# Patient Record
Sex: Male | Born: 1965 | Race: White | Hispanic: No | Smoking: Never smoker
Health system: Southern US, Community
[De-identification: ages and names within clinical notes are randomized; demographics above are authoritative.]

## PROBLEM LIST (undated history)

## (undated) DIAGNOSIS — R251 Tremor, unspecified: Secondary | ICD-10-CM

## (undated) DIAGNOSIS — I10 Essential (primary) hypertension: Secondary | ICD-10-CM

## (undated) DIAGNOSIS — I341 Nonrheumatic mitral (valve) prolapse: Secondary | ICD-10-CM

## (undated) HISTORY — DX: Nonrheumatic mitral (valve) prolapse: I34.1

## (undated) HISTORY — DX: Essential (primary) hypertension: I10

## (undated) HISTORY — DX: Tremor, unspecified: R25.1

---

## 1987-07-14 HISTORY — PX: OTHER SURGICAL HISTORY: SHX169

## 2004-04-24 ENCOUNTER — Ambulatory Visit (HOSPITAL_COMMUNITY): Admission: RE | Admit: 2004-04-24 | Discharge: 2004-04-24 | Payer: Self-pay | Admitting: Orthopedic Surgery

## 2004-05-25 ENCOUNTER — Ambulatory Visit (HOSPITAL_BASED_OUTPATIENT_CLINIC_OR_DEPARTMENT_OTHER): Admission: RE | Admit: 2004-05-25 | Discharge: 2004-05-25 | Payer: Self-pay | Admitting: Orthopedic Surgery

## 2004-05-25 ENCOUNTER — Ambulatory Visit (HOSPITAL_COMMUNITY): Admission: RE | Admit: 2004-05-25 | Discharge: 2004-05-25 | Payer: Self-pay | Admitting: Orthopedic Surgery

## 2005-12-03 IMAGING — US US EXTREM LOW NON VASC*L*
1 series · 14 of 25 positions shown · non-contrast
Comparison: Recent plain films of the left hand from Dr. [REDACTED].

CLINICAL DATA: Palpable mass along the hypothenar eminence of the left hand.

ULTRASOUND OF THE LEFT UPPER EXTREMITY  (HAND) 04/24/2004:

[Series 1: unknown · 0.07mm/px · 14 of 28 slices shown]
[im 1/28]
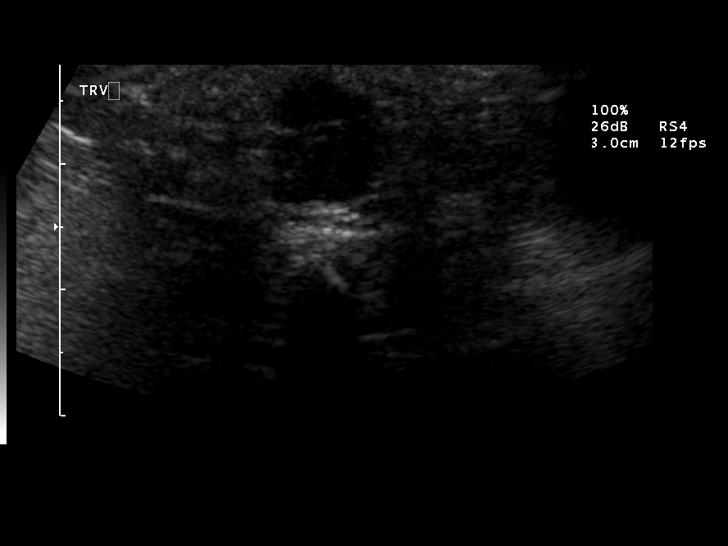
[im 3/28]
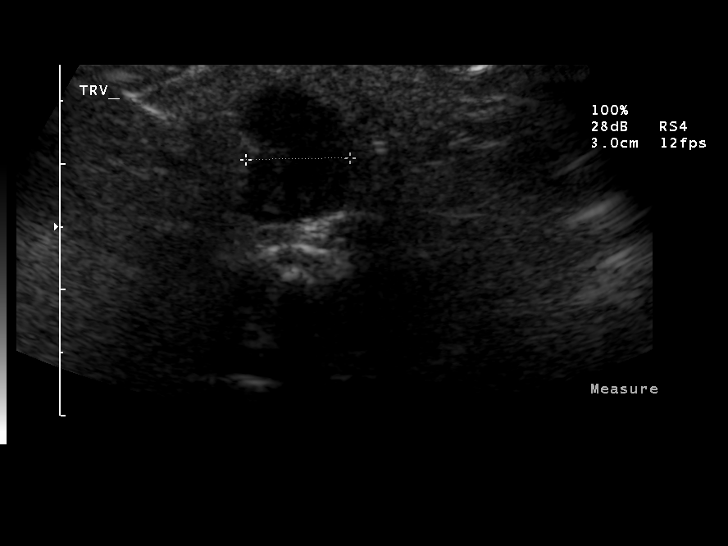
[im 5/28]
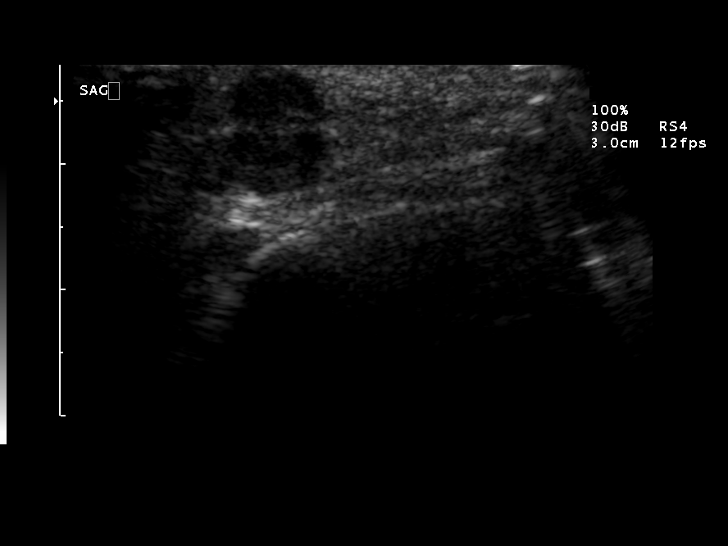
[im 7/28]
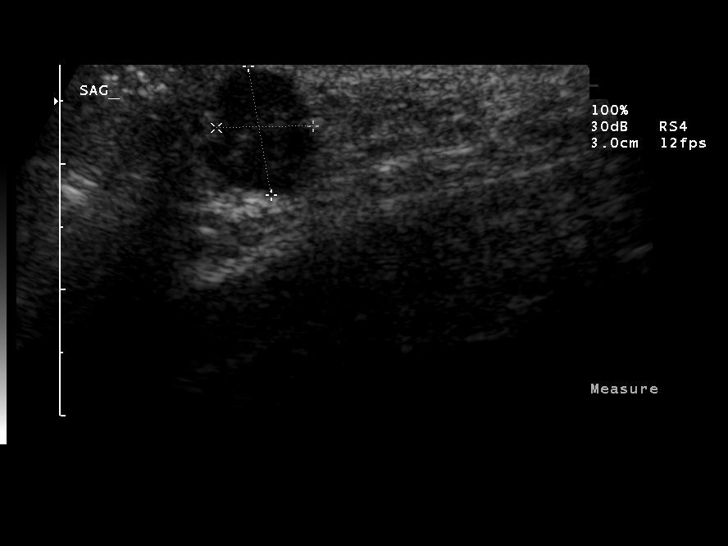
[im 10/28]
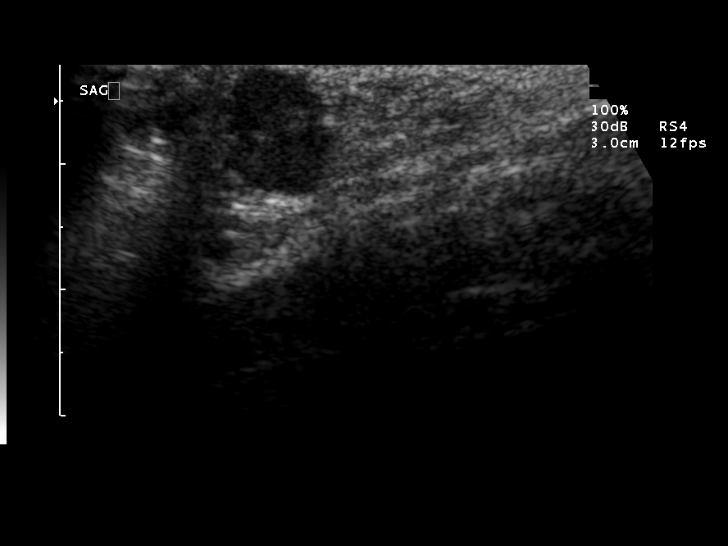
[im 11/28]
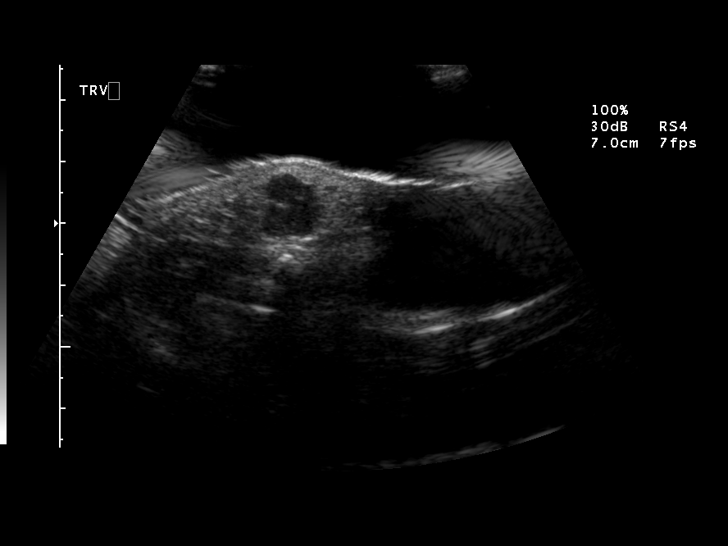
[im 13/28]
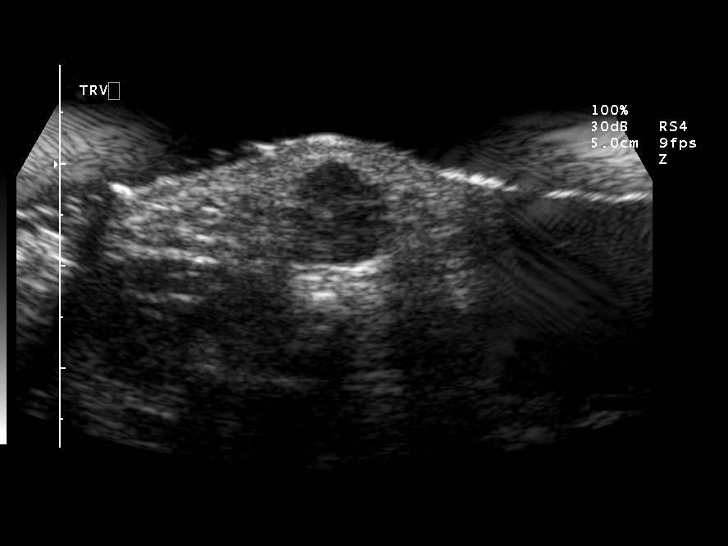
[im 15/28]
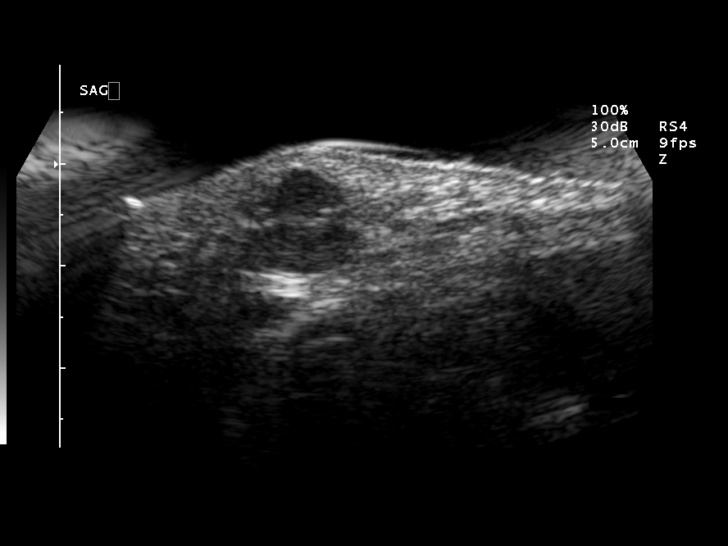
[im 17/28]
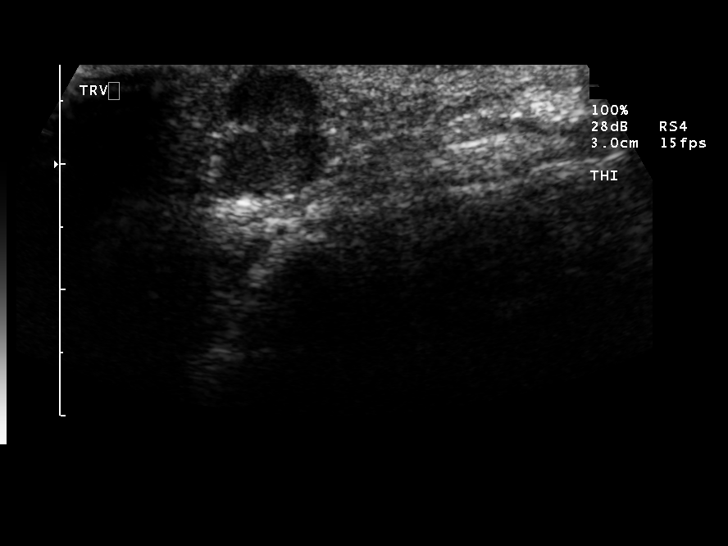
[im 19/28]
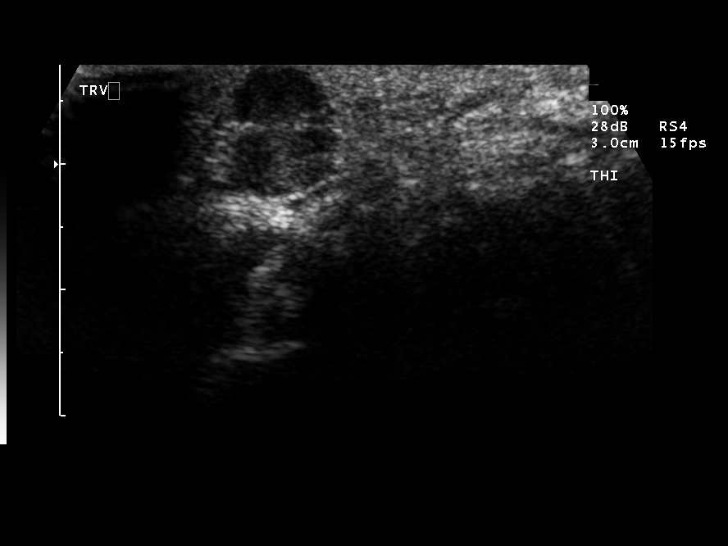
[im 21/28]
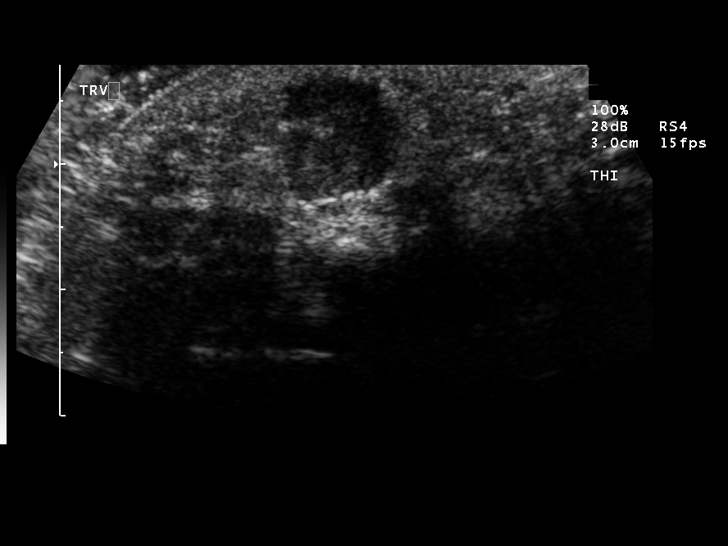
[im 23/28]
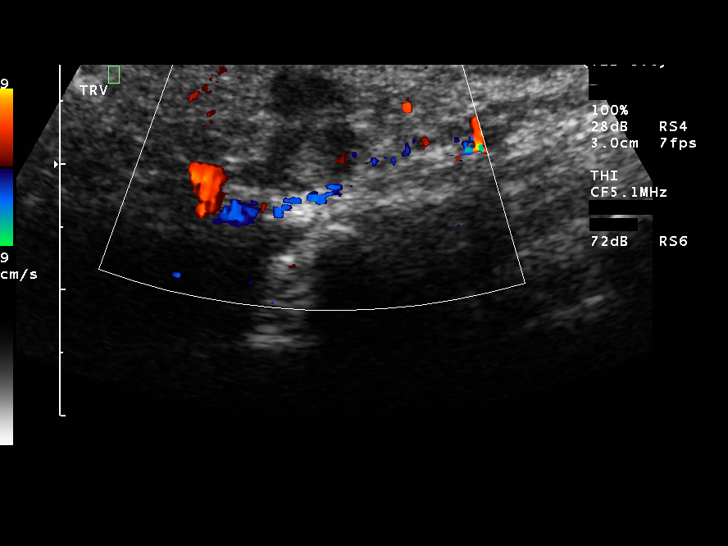
[im 25/28]
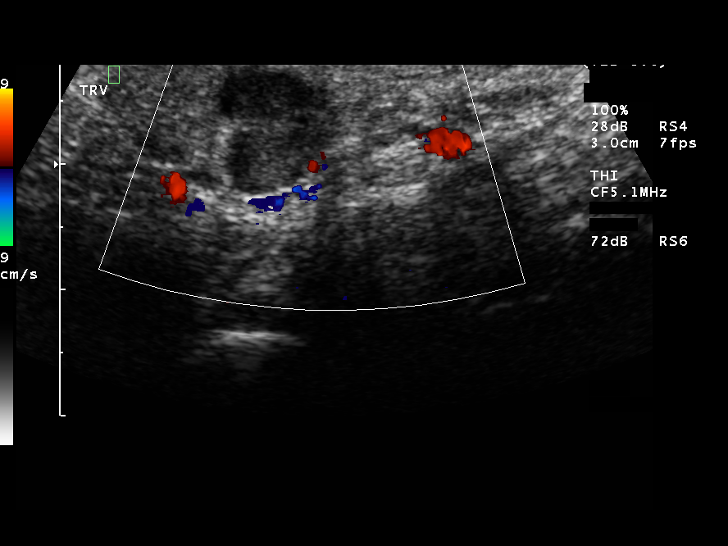
[im 28/28]
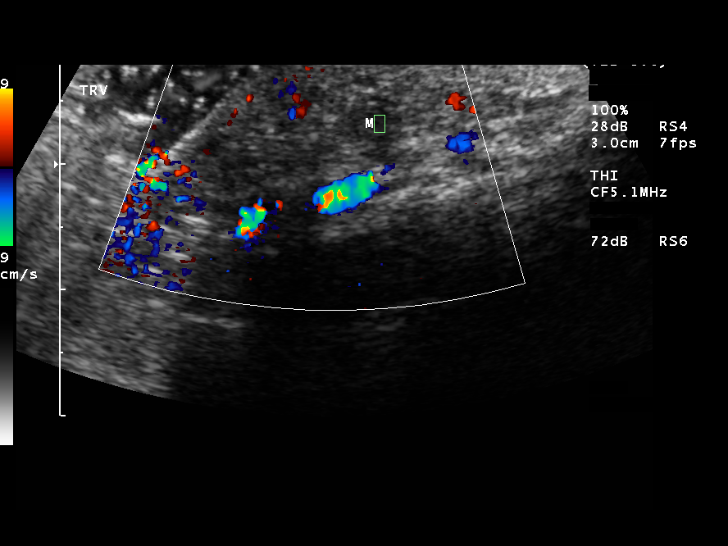

[14 of 25 positions shown; findings below may reference images not displayed]

FINDINGS: The mass along the hypothenar eminence is a hypoechoic lesion that
measures approximately 1.0 x 0.8 x 0.8 cm. It is intimately associated with one
of the branches of the ulnar artery in the hand, possibly the hypothenar artery.
There is internal flow within the mass upon color Doppler interrogation.
IMPRESSION: Hypoechoic approximate 1 cm mass in the hypothenar eminence of the left hand.
The mass is intimately associated with one of the arteries in the hand, and
therefore, an aneurysm is not excluded. Differential diagnosis might include a
neuroma.

## 2006-02-12 HISTORY — PX: OTHER SURGICAL HISTORY: SHX169

## 2016-03-12 DIAGNOSIS — L570 Actinic keratosis: Secondary | ICD-10-CM | POA: Diagnosis not present

## 2016-08-20 DIAGNOSIS — L821 Other seborrheic keratosis: Secondary | ICD-10-CM | POA: Diagnosis not present

## 2016-08-20 DIAGNOSIS — Z85828 Personal history of other malignant neoplasm of skin: Secondary | ICD-10-CM | POA: Diagnosis not present

## 2016-08-20 DIAGNOSIS — L738 Other specified follicular disorders: Secondary | ICD-10-CM | POA: Diagnosis not present

## 2017-04-26 DIAGNOSIS — R079 Chest pain, unspecified: Secondary | ICD-10-CM | POA: Diagnosis not present

## 2017-04-26 DIAGNOSIS — I10 Essential (primary) hypertension: Secondary | ICD-10-CM | POA: Diagnosis not present

## 2017-04-26 DIAGNOSIS — R002 Palpitations: Secondary | ICD-10-CM | POA: Diagnosis not present

## 2017-04-26 DIAGNOSIS — Z13220 Encounter for screening for lipoid disorders: Secondary | ICD-10-CM | POA: Diagnosis not present

## 2017-04-29 ENCOUNTER — Telehealth: Payer: Self-pay

## 2017-04-29 NOTE — Telephone Encounter (Signed)
Sent referral to scheduling, attached notes to March file.

## 2017-05-09 ENCOUNTER — Encounter: Payer: Self-pay | Admitting: Internal Medicine

## 2017-05-09 ENCOUNTER — Ambulatory Visit (INDEPENDENT_AMBULATORY_CARE_PROVIDER_SITE_OTHER): Payer: 59 | Admitting: Internal Medicine

## 2017-05-09 ENCOUNTER — Encounter (INDEPENDENT_AMBULATORY_CARE_PROVIDER_SITE_OTHER): Payer: Self-pay

## 2017-05-09 VITALS — BP 110/80 | HR 73 | Ht 67.0 in | Wt 162.6 lb

## 2017-05-09 DIAGNOSIS — I10 Essential (primary) hypertension: Secondary | ICD-10-CM | POA: Diagnosis not present

## 2017-05-09 DIAGNOSIS — I341 Nonrheumatic mitral (valve) prolapse: Secondary | ICD-10-CM | POA: Diagnosis not present

## 2017-05-09 DIAGNOSIS — R002 Palpitations: Secondary | ICD-10-CM | POA: Diagnosis not present

## 2017-05-09 DIAGNOSIS — R0602 Shortness of breath: Secondary | ICD-10-CM

## 2017-05-09 DIAGNOSIS — R0789 Other chest pain: Secondary | ICD-10-CM

## 2017-05-09 MED ORDER — FAMOTIDINE 20 MG PO TABS: 20.0000 mg | ORAL_TABLET | Freq: Two times a day (BID) | ORAL | 3 refills | Status: AC

## 2017-05-09 NOTE — Progress Notes (Signed)
New Outpatient Visit Date: 05/09/2017  Referring Provider: London Pepper, MD [place] [address], [place] [address]  Chief Complaint: Chest pain and irregular heart beat  HPI:  Mr. Hawes is a 52 y.o. male who is being seen today for the evaluation of chest pain and irregular heart beat at the request of Dr. Orland Mustard. He has a history of mitral valve prolapse, tremor, and recently diagnosed hypertesion.  Mr. Lazcano reports that over the last 1.5 years, he has experienced an almost constant dull ache in the left side of the chest.  The intensity is typically 3-4/10 but occasionally will flair up to 7/10.  During the flairs, which occur once or twice a month, he also notes some pressure in the left side of the neck.  He notes occasional accompanying shortness of breath.  He also reports intermittent palpitations during which it feels like his heart races for a few seconds.  This occurs about once a month and is not related to the aforementioned chest pain.  He denies heart burn but burps frequently.  He has not exercised regularly the last few months.  However, the aforementioned chest pain actually seems to improve with activity.  Mr. Lege reports extensive cardiac workup in [facility] about 20 years ago, including echo, stress test, and cardiac catheterization.  Other than previously noted mitral valve prolapse, he believes that everything was normal.  Earlier this month, his blood pressure was found to be quite elevated at Dr. Darien Ramus office, prompting initiation of lisinopril 10 mg daily.  Mr. Weigelt has been tolerating this well.  He denies lightheadedness, edema, orthopnea, and claudication.  --------------------------------------------------------------------------------------------------  Cardiovascular History & Procedures: Cardiovascular Problems:  Palpitations  Chest pain  Mitral valve prolapse  Risk Factors:  Male gender  Cath/PCI:  None  CV  Surgery:  None  EP Procedures and Devices:  None  Non-Invasive Evaluation(s):  None  --------------------------------------------------------------------------------------------------  Past Medical History:  Diagnosis Date  . Elevated BP without diagnosis of hypertension   . Hypertension   . Mitral valve prolapse     Past Surgical History:  Procedure Laterality Date  . left hand ganglion cyst removal  2008  . right inguinal hernia  07/1987    Current Meds  Medication Sig  . lisinopril (PRINIVIL,ZESTRIL) 10 MG tablet Take 10 mg by mouth daily.  Marland Kitchen SPIRULINA PO Take 1 tablet by mouth daily.    Allergies: Patient has no known allergies.  Social History   Socioeconomic History  . Marital status: Divorced    Spouse name: Not on file  . Number of children: Not on file  . Years of education: Not on file  . Highest education level: Not on file  Occupational History  . Not on file  Social Needs  . Financial resource strain: Not on file  . Food insecurity:    Worry: Not on file    Inability: Not on file  . Transportation needs:    Medical: Not on file    Non-medical: Not on file  Tobacco Use  . Smoking status: Never Smoker  . Smokeless tobacco: Never Used  Substance and Sexual Activity  . Alcohol use: Yes    Comment: 3 drinks per month  . Drug use: Not on file  . Sexual activity: Not on file  Lifestyle  . Physical activity:    Days per week: Not on file    Minutes per session: Not on file  . Stress: Not on file  Relationships  .  Social connections:    Talks on phone: Not on file    Gets together: Not on file    Attends religious service: Not on file    Active member of club or organization: Not on file    Attends meetings of clubs or organizations: Not on file    Relationship status: Not on file  . Intimate partner violence:    Fear of current or ex partner: Not on file    Emotionally abused: Not on file    Physically abused: Not on file    Forced  sexual activity: Not on file  Other Topics Concern  . Not on file  Social History Narrative  . Not on file    Family History  Problem Relation Age of Onset  . Heart attack Mother 23  . Coronary artery disease Mother   . Dementia Father   . Other Brother        reflux  . Healthy Brother     Review of Systems: A 12-system review of systems was performed and was negative except as noted in the HPI.  --------------------------------------------------------------------------------------------------  Physical Exam: BP 110/80   Pulse 73   Ht 5\' 7"  (1.702 m)   Wt 162 lb 9.6 oz (73.8 kg)   BMI 25.47 kg/m   General:  Well-developed, well-nourished man, seated comfortably in the exam room. HEENT: No conjunctival pallor or scleral icterus. Moist mucous membranes. OP clear. Neck: Supple without lymphadenopathy, thyromegaly, JVD, or HJR. No carotid bruit. Lungs: Normal work of breathing. Clear to auscultation bilaterally without wheezes or crackles. Heart: Regular rate and rhythm without murmurs, rubs, or gallops. Non-displaced PMI. Abd: Bowel sounds present. Soft, NT/ND without hepatosplenomegaly Ext: No lower extremity edema. Radial, PT, and DP pulses are 2+ bilaterally Skin: Warm and dry without rash. Neuro: CNIII-XII intact. Strength and fine-touch sensation intact in upper and lower extremities bilaterally. Psych: Normal mood and affect.  EKG:  NSR without abnormalities.  Outside labs (04/26/17): BMP: Na 139, K 4.4, Cl 105, CO2 27, BUN 12, creatinine 0.92, Ca 9.2  CBC: WBC 4.6, HGB 15.9, HCT 46.7%, platelets 208  Lipid panel: Total cholesterol 165, HDL 40, LDL 107, triglycerides 91  TSH: 1.76  --------------------------------------------------------------------------------------------------  ASSESSMENT AND PLAN: Atypical chest pain and shortness of breath Symptoms present almost constantly for 1.5 years with occasional flairs, which is not typical for angina.  Cardiac  risk factors include family history, male gender, and recently diagnosed hypertension.  I have recommended an exercise tolerance test and transthoracic echocardiogram (given h/o MVP and intermittent shortness of breath).  Given potential for GERD as well, I have recommended that Mr. Blocher start taking famotidine 20 mg BID.  Palpitations Sporadic and brief.  No accompanying worrisome symptoms.  We will proceed with stress test and echo, as above.  Given relative infrequent occurrence, we will defer event monitor placement.  Recent labs by Dr. Orland Mustard were normal.  Essential hypertension BP well-controlled today.  Continue lisinopril and follow-up with Dr. Orland Mustard.  Follow-up: Return to clinic in 6 weeks.  Nelva Bush, MD 05/09/2017 8:42 AM

## 2017-05-09 NOTE — Patient Instructions (Addendum)
Medication Instructions:  Famotidine (pepcid) 20 mg by mouth two times per day  -- If you need a refill on your cardiac medications before your next appointment, please call your pharmacy. --  Labwork: None ordered  Testing/Procedures: To be scheduled in 1-2 weeks  Your physician has requested that you have an echocardiogram. Echocardiography is a painless test that uses sound waves to create images of your heart. It provides your doctor with information about the size and shape of your heart and how well your heart's chambers and valves are working. This procedure takes approximately one hour. There are no restrictions for this procedure.  Your physician has requested that you have an exercise tolerance test. For further information please visit [email or web address]. Please also follow instruction sheet, as given.   Follow-Up: Your physician wants you to follow-up in: 6 weeks with APP     Thank you for choosing [facility]!!    Any Other Special Instructions Will Be Listed Below (If Applicable).

## 2017-05-13 ENCOUNTER — Ambulatory Visit (HOSPITAL_COMMUNITY): Payer: 59 | Attending: Internal Medicine

## 2017-05-13 ENCOUNTER — Other Ambulatory Visit: Payer: Self-pay

## 2017-05-13 ENCOUNTER — Ambulatory Visit (INDEPENDENT_AMBULATORY_CARE_PROVIDER_SITE_OTHER): Payer: 59

## 2017-05-13 DIAGNOSIS — R0789 Other chest pain: Secondary | ICD-10-CM | POA: Insufficient documentation

## 2017-05-13 DIAGNOSIS — I119 Hypertensive heart disease without heart failure: Secondary | ICD-10-CM | POA: Diagnosis not present

## 2017-05-13 DIAGNOSIS — R0600 Dyspnea, unspecified: Secondary | ICD-10-CM | POA: Insufficient documentation

## 2017-05-13 DIAGNOSIS — R0602 Shortness of breath: Secondary | ICD-10-CM | POA: Insufficient documentation

## 2017-05-13 DIAGNOSIS — Z8249 Family history of ischemic heart disease and other diseases of the circulatory system: Secondary | ICD-10-CM | POA: Diagnosis not present

## 2017-05-13 DIAGNOSIS — R079 Chest pain, unspecified: Secondary | ICD-10-CM | POA: Diagnosis not present

## 2017-05-13 DIAGNOSIS — I341 Nonrheumatic mitral (valve) prolapse: Secondary | ICD-10-CM | POA: Diagnosis not present

## 2017-05-13 DIAGNOSIS — R002 Palpitations: Secondary | ICD-10-CM | POA: Insufficient documentation

## 2017-05-13 LAB — EXERCISE TOLERANCE TEST
CHL CUP MPHR: 167 {beats}/min
Estimated workload: 10.1 METS
Exercise duration (min): 8 min
Exercise duration (sec): 30 s
Peak HR: 160 {beats}/min
Percent HR: 95 %
RPE: 17
Rest HR: 69 {beats}/min

## 2017-05-17 DIAGNOSIS — I10 Essential (primary) hypertension: Secondary | ICD-10-CM | POA: Diagnosis not present

## 2017-06-12 ENCOUNTER — Encounter: Payer: Self-pay | Admitting: Physician Assistant

## 2017-06-26 DIAGNOSIS — Z1211 Encounter for screening for malignant neoplasm of colon: Secondary | ICD-10-CM | POA: Diagnosis not present

## 2017-07-01 NOTE — Progress Notes (Signed)
Cardiology Office Note:    Date:  07/02/2017   ID:  CONCEPCION KIRKPATRICK, DOB 1965-06-20, MRN [id number]  PCP:  London Pepper, MD  Cardiologist:  Nelva Bush, MD   Referring MD: London Pepper, MD   Chief Complaint  Patient presents with  . Follow-up    results of recent echo and stress test    History of Present Illness:    Damon Haas is a 52 y.o. male with hypertension, MVP, tremor.  He was evaluated by Dr. Harrell Gave End in 3/19 for chest pain and an irregular heartbeat.  A GXT and echocardiogram were arranged.  The GXT was normal and the echocardiogram demonstrated normal ejection fraction with mild diastolic dysfunction. Due to the infrequent nature of his palpitations, it was not felt to be necessary to obtain an event monitor.    Mr. Burridge returns for follow up.  He is here alone.  He is doing well.  He denies any chest discomfort, shortness of breath, edema, palpitations.   Prior CV studies:   The following studies were reviewed today:  Echo 05/13/17 EF 55-60, Gr 1 DD  Exercise Tolerance Test 05/13/17 Normal exercise stress test. Normal exercise tolerance. Normal BP response to exertion.   Past Medical History:  Diagnosis Date  . Hypertension   . Mitral valve prolapse   . Tremor    Surgical Hx: The patient  has a past surgical history that includes left hand ganglion cyst removal (2008) and right inguinal hernia (07/1987).   Current Medications: Current Meds  Medication Sig  . famotidine (PEPCID) 20 MG tablet Take 1 tablet (20 mg total) by mouth 2 (two) times daily.  Marland Kitchen lisinopril (PRINIVIL,ZESTRIL) 10 MG tablet Take 10 mg by mouth daily.  Marland Kitchen SPIRULINA PO Take 1 tablet by mouth daily.     Allergies:   Patient has no known allergies.   Social History   Tobacco Use  . Smoking status: Never Smoker  . Smokeless tobacco: Never Used  Substance Use Topics  . Alcohol use: Yes    Comment: 3 drinks per month  . Drug use: Never     Family Hx: The  patient's family history includes Coronary artery disease in his mother; Dementia in his father; Healthy in his brother; Heart attack (age of onset: 72) in his mother; Other in his brother.  ROS:   Please see the history of present illness.    ROS All other systems reviewed and are negative.   EKGs/Labs/Other Test Reviewed:    EKG:  EKG is not ordered today.    Recent Labs: No results found for requested labs within last 8760 hours.   Recent Lipid Panel No results found for: CHOL, TRIG, HDL, CHOLHDL, LDLCALC, LDLDIRECT  Physical Exam:    VS:  BP 118/78   Pulse 91   Ht 5\' 7"  (1.702 m)   Wt 163 lb 4 oz (74 kg)   SpO2 98%   BMI 25.57 kg/m     Wt Readings from Last 3 Encounters:  07/02/17 163 lb 4 oz (74 kg)  05/09/17 162 lb 9.6 oz (73.8 kg)     Physical Exam  Constitutional: He is oriented to person, place, and time. He appears well-developed and well-nourished. No distress.  HENT:  Head: Normocephalic and atraumatic.  Neck: Neck supple. Carotid bruit is not present.  Cardiovascular: Normal rate, regular rhythm, S1 normal and S2 normal.  No murmur heard. Pulmonary/Chest: Breath sounds normal. He has no rales.  Abdominal: Soft.  Musculoskeletal:  He exhibits no edema.  Neurological: He is alert and oriented to person, place, and time.  Skin: Skin is warm and dry.    ASSESSMENT & PLAN:    Essential hypertension The patient's blood pressure is controlled on his current regimen.  Continue current therapy.    Palpitations Overall quiescent.  Recent stress test was low risk and the echocardiogram demonstrated normal ejection fraction.  We discussed his results today.  He denies any chest pain.  At this point, he can follow up with Cardiology as needed.   Dispo:  Return As needed, for recurrent symptoms w/ Dr. Saunders Revel, or Richardson Dopp, PA-C.   Medication Adjustments/Labs and Tests Ordered: Current medicines are reviewed at length with the patient today.  Concerns regarding  medicines are outlined above.  Tests Ordered: No orders of the defined types were placed in this encounter.  Medication Changes: No orders of the defined types were placed in this encounter.   Signed, Richardson Dopp, PA-C  07/02/2017 8:34 AM    [facility] [address], [place], [place]  [address] Phone: [phone]; Fax: [phone]

## 2017-07-02 ENCOUNTER — Encounter: Payer: Self-pay | Admitting: Physician Assistant

## 2017-07-02 ENCOUNTER — Ambulatory Visit (INDEPENDENT_AMBULATORY_CARE_PROVIDER_SITE_OTHER): Payer: 59 | Admitting: Physician Assistant

## 2017-07-02 VITALS — BP 118/78 | HR 91 | Ht 67.0 in | Wt 163.2 lb

## 2017-07-02 DIAGNOSIS — I10 Essential (primary) hypertension: Secondary | ICD-10-CM | POA: Diagnosis not present

## 2017-07-02 DIAGNOSIS — R002 Palpitations: Secondary | ICD-10-CM

## 2017-07-02 NOTE — Patient Instructions (Signed)
Medication Instructions:  Your physician recommends that you continue on your current medications as directed. Please refer to the Current Medication list given to you today.   Labwork: NONE ORDERED TODAY.  Testing/Procedures: NONE ORDERED TODAY.  Follow-Up: FOLLOW AS NEEDED WITH DR. END.  Any Other Special Instructions Will Be Listed Below (If Applicable).     If you need a refill on your cardiac medications before your next appointment, please call your pharmacy.

## 2017-07-25 DIAGNOSIS — Z125 Encounter for screening for malignant neoplasm of prostate: Secondary | ICD-10-CM | POA: Diagnosis not present

## 2017-07-25 DIAGNOSIS — I10 Essential (primary) hypertension: Secondary | ICD-10-CM | POA: Diagnosis not present

## 2017-07-25 DIAGNOSIS — Z0000 Encounter for general adult medical examination without abnormal findings: Secondary | ICD-10-CM | POA: Diagnosis not present

## 2017-08-16 DIAGNOSIS — Z1211 Encounter for screening for malignant neoplasm of colon: Secondary | ICD-10-CM | POA: Diagnosis not present

## 2017-08-16 DIAGNOSIS — D126 Benign neoplasm of colon, unspecified: Secondary | ICD-10-CM | POA: Diagnosis not present

## 2017-08-16 DIAGNOSIS — K640 First degree hemorrhoids: Secondary | ICD-10-CM | POA: Diagnosis not present

## 2017-10-02 DIAGNOSIS — D2262 Melanocytic nevi of left upper limb, including shoulder: Secondary | ICD-10-CM | POA: Diagnosis not present

## 2017-10-02 DIAGNOSIS — Z85828 Personal history of other malignant neoplasm of skin: Secondary | ICD-10-CM | POA: Diagnosis not present

## 2017-10-02 DIAGNOSIS — L821 Other seborrheic keratosis: Secondary | ICD-10-CM | POA: Diagnosis not present

## 2018-11-26 DIAGNOSIS — D485 Neoplasm of uncertain behavior of skin: Secondary | ICD-10-CM | POA: Diagnosis not present

## 2018-11-26 DIAGNOSIS — Z85828 Personal history of other malignant neoplasm of skin: Secondary | ICD-10-CM | POA: Diagnosis not present

## 2018-11-26 DIAGNOSIS — D2261 Melanocytic nevi of right upper limb, including shoulder: Secondary | ICD-10-CM | POA: Diagnosis not present

## 2018-11-26 DIAGNOSIS — L820 Inflamed seborrheic keratosis: Secondary | ICD-10-CM | POA: Diagnosis not present

## 2018-11-26 DIAGNOSIS — D2272 Melanocytic nevi of left lower limb, including hip: Secondary | ICD-10-CM | POA: Diagnosis not present

## 2018-11-26 DIAGNOSIS — D225 Melanocytic nevi of trunk: Secondary | ICD-10-CM | POA: Diagnosis not present

## 2018-11-26 DIAGNOSIS — C44619 Basal cell carcinoma of skin of left upper limb, including shoulder: Secondary | ICD-10-CM | POA: Diagnosis not present

## 2019-02-23 DIAGNOSIS — L821 Other seborrheic keratosis: Secondary | ICD-10-CM | POA: Diagnosis not present

## 2019-02-23 DIAGNOSIS — Z85828 Personal history of other malignant neoplasm of skin: Secondary | ICD-10-CM | POA: Diagnosis not present

## 2019-02-23 DIAGNOSIS — L308 Other specified dermatitis: Secondary | ICD-10-CM | POA: Diagnosis not present

## 2019-10-20 DIAGNOSIS — Z125 Encounter for screening for malignant neoplasm of prostate: Secondary | ICD-10-CM | POA: Diagnosis not present

## 2019-10-20 DIAGNOSIS — Z0000 Encounter for general adult medical examination without abnormal findings: Secondary | ICD-10-CM | POA: Diagnosis not present

## 2019-10-20 DIAGNOSIS — Z13220 Encounter for screening for lipoid disorders: Secondary | ICD-10-CM | POA: Diagnosis not present

## 2019-11-30 DIAGNOSIS — D2261 Melanocytic nevi of right upper limb, including shoulder: Secondary | ICD-10-CM | POA: Diagnosis not present

## 2019-11-30 DIAGNOSIS — D485 Neoplasm of uncertain behavior of skin: Secondary | ICD-10-CM | POA: Diagnosis not present

## 2019-11-30 DIAGNOSIS — Z85828 Personal history of other malignant neoplasm of skin: Secondary | ICD-10-CM | POA: Diagnosis not present

## 2019-11-30 DIAGNOSIS — D2262 Melanocytic nevi of left upper limb, including shoulder: Secondary | ICD-10-CM | POA: Diagnosis not present

## 2019-11-30 DIAGNOSIS — L738 Other specified follicular disorders: Secondary | ICD-10-CM | POA: Diagnosis not present

## 2019-11-30 DIAGNOSIS — D2272 Melanocytic nevi of left lower limb, including hip: Secondary | ICD-10-CM | POA: Diagnosis not present

## 2020-03-18 DIAGNOSIS — M7989 Other specified soft tissue disorders: Secondary | ICD-10-CM | POA: Diagnosis not present

## 2020-03-18 DIAGNOSIS — R221 Localized swelling, mass and lump, neck: Secondary | ICD-10-CM | POA: Diagnosis not present

## 2020-11-03 DIAGNOSIS — I10 Essential (primary) hypertension: Secondary | ICD-10-CM | POA: Diagnosis not present

## 2020-11-03 DIAGNOSIS — Z23 Encounter for immunization: Secondary | ICD-10-CM | POA: Diagnosis not present

## 2020-11-03 DIAGNOSIS — Z125 Encounter for screening for malignant neoplasm of prostate: Secondary | ICD-10-CM | POA: Diagnosis not present

## 2020-11-03 DIAGNOSIS — Z0000 Encounter for general adult medical examination without abnormal findings: Secondary | ICD-10-CM | POA: Diagnosis not present

## 2020-11-03 DIAGNOSIS — Z13220 Encounter for screening for lipoid disorders: Secondary | ICD-10-CM | POA: Diagnosis not present

## 2020-12-07 DIAGNOSIS — D2261 Melanocytic nevi of right upper limb, including shoulder: Secondary | ICD-10-CM | POA: Diagnosis not present

## 2020-12-07 DIAGNOSIS — D2262 Melanocytic nevi of left upper limb, including shoulder: Secondary | ICD-10-CM | POA: Diagnosis not present

## 2020-12-07 DIAGNOSIS — L918 Other hypertrophic disorders of the skin: Secondary | ICD-10-CM | POA: Diagnosis not present

## 2020-12-07 DIAGNOSIS — Z85828 Personal history of other malignant neoplasm of skin: Secondary | ICD-10-CM | POA: Diagnosis not present

## 2020-12-07 DIAGNOSIS — L820 Inflamed seborrheic keratosis: Secondary | ICD-10-CM | POA: Diagnosis not present

## 2020-12-07 DIAGNOSIS — D485 Neoplasm of uncertain behavior of skin: Secondary | ICD-10-CM | POA: Diagnosis not present

## 2020-12-07 DIAGNOSIS — L570 Actinic keratosis: Secondary | ICD-10-CM | POA: Diagnosis not present

## 2020-12-07 DIAGNOSIS — D225 Melanocytic nevi of trunk: Secondary | ICD-10-CM | POA: Diagnosis not present

## 2021-01-02 DIAGNOSIS — L988 Other specified disorders of the skin and subcutaneous tissue: Secondary | ICD-10-CM | POA: Diagnosis not present

## 2021-01-02 DIAGNOSIS — D485 Neoplasm of uncertain behavior of skin: Secondary | ICD-10-CM | POA: Diagnosis not present

## 2021-04-28 DIAGNOSIS — I10 Essential (primary) hypertension: Secondary | ICD-10-CM | POA: Diagnosis not present

## 2021-11-17 DIAGNOSIS — Z125 Encounter for screening for malignant neoplasm of prostate: Secondary | ICD-10-CM | POA: Diagnosis not present

## 2021-11-17 DIAGNOSIS — Z13220 Encounter for screening for lipoid disorders: Secondary | ICD-10-CM | POA: Diagnosis not present

## 2021-11-17 DIAGNOSIS — Z0000 Encounter for general adult medical examination without abnormal findings: Secondary | ICD-10-CM | POA: Diagnosis not present

## 2021-11-17 DIAGNOSIS — R251 Tremor, unspecified: Secondary | ICD-10-CM | POA: Diagnosis not present

## 2021-11-17 DIAGNOSIS — I10 Essential (primary) hypertension: Secondary | ICD-10-CM | POA: Diagnosis not present

## 2022-01-30 DIAGNOSIS — D225 Melanocytic nevi of trunk: Secondary | ICD-10-CM | POA: Diagnosis not present

## 2022-01-30 DIAGNOSIS — Z85828 Personal history of other malignant neoplasm of skin: Secondary | ICD-10-CM | POA: Diagnosis not present

## 2022-01-30 DIAGNOSIS — D2261 Melanocytic nevi of right upper limb, including shoulder: Secondary | ICD-10-CM | POA: Diagnosis not present

## 2022-01-30 DIAGNOSIS — L820 Inflamed seborrheic keratosis: Secondary | ICD-10-CM | POA: Diagnosis not present

## 2022-01-30 DIAGNOSIS — D2262 Melanocytic nevi of left upper limb, including shoulder: Secondary | ICD-10-CM | POA: Diagnosis not present

## 2022-07-20 DIAGNOSIS — H33193 Other retinoschisis and retinal cysts, bilateral: Secondary | ICD-10-CM | POA: Diagnosis not present

## 2022-07-20 DIAGNOSIS — H33001 Unspecified retinal detachment with retinal break, right eye: Secondary | ICD-10-CM | POA: Diagnosis not present

## 2022-07-20 DIAGNOSIS — H43823 Vitreomacular adhesion, bilateral: Secondary | ICD-10-CM | POA: Diagnosis not present

## 2022-09-19 DIAGNOSIS — F329 Major depressive disorder, single episode, unspecified: Secondary | ICD-10-CM | POA: Diagnosis not present

## 2022-09-19 DIAGNOSIS — F418 Other specified anxiety disorders: Secondary | ICD-10-CM | POA: Diagnosis not present

## 2022-10-05 DIAGNOSIS — F329 Major depressive disorder, single episode, unspecified: Secondary | ICD-10-CM | POA: Diagnosis not present

## 2022-10-05 DIAGNOSIS — F418 Other specified anxiety disorders: Secondary | ICD-10-CM | POA: Diagnosis not present

## 2022-11-02 DIAGNOSIS — F329 Major depressive disorder, single episode, unspecified: Secondary | ICD-10-CM | POA: Diagnosis not present

## 2022-11-02 DIAGNOSIS — F418 Other specified anxiety disorders: Secondary | ICD-10-CM | POA: Diagnosis not present

## 2022-11-23 DIAGNOSIS — F329 Major depressive disorder, single episode, unspecified: Secondary | ICD-10-CM | POA: Diagnosis not present

## 2022-11-23 DIAGNOSIS — F418 Other specified anxiety disorders: Secondary | ICD-10-CM | POA: Diagnosis not present

## 2022-12-13 DIAGNOSIS — R1011 Right upper quadrant pain: Secondary | ICD-10-CM | POA: Diagnosis not present

## 2022-12-13 DIAGNOSIS — I10 Essential (primary) hypertension: Secondary | ICD-10-CM | POA: Diagnosis not present

## 2022-12-13 DIAGNOSIS — Z125 Encounter for screening for malignant neoplasm of prostate: Secondary | ICD-10-CM | POA: Diagnosis not present

## 2022-12-13 DIAGNOSIS — Z0000 Encounter for general adult medical examination without abnormal findings: Secondary | ICD-10-CM | POA: Diagnosis not present

## 2022-12-21 DIAGNOSIS — F329 Major depressive disorder, single episode, unspecified: Secondary | ICD-10-CM | POA: Diagnosis not present

## 2022-12-21 DIAGNOSIS — F418 Other specified anxiety disorders: Secondary | ICD-10-CM | POA: Diagnosis not present

## 2023-01-18 DIAGNOSIS — F418 Other specified anxiety disorders: Secondary | ICD-10-CM | POA: Diagnosis not present

## 2023-01-18 DIAGNOSIS — F329 Major depressive disorder, single episode, unspecified: Secondary | ICD-10-CM | POA: Diagnosis not present

## 2023-01-29 DIAGNOSIS — C44519 Basal cell carcinoma of skin of other part of trunk: Secondary | ICD-10-CM | POA: Diagnosis not present

## 2023-02-01 DIAGNOSIS — F418 Other specified anxiety disorders: Secondary | ICD-10-CM | POA: Diagnosis not present

## 2023-02-01 DIAGNOSIS — F329 Major depressive disorder, single episode, unspecified: Secondary | ICD-10-CM | POA: Diagnosis not present

## 2023-02-15 DIAGNOSIS — F418 Other specified anxiety disorders: Secondary | ICD-10-CM | POA: Diagnosis not present

## 2023-02-15 DIAGNOSIS — F329 Major depressive disorder, single episode, unspecified: Secondary | ICD-10-CM | POA: Diagnosis not present

## 2023-03-01 DIAGNOSIS — F329 Major depressive disorder, single episode, unspecified: Secondary | ICD-10-CM | POA: Diagnosis not present

## 2023-03-01 DIAGNOSIS — F418 Other specified anxiety disorders: Secondary | ICD-10-CM | POA: Diagnosis not present

## 2023-04-12 DIAGNOSIS — F329 Major depressive disorder, single episode, unspecified: Secondary | ICD-10-CM | POA: Diagnosis not present

## 2023-04-12 DIAGNOSIS — F418 Other specified anxiety disorders: Secondary | ICD-10-CM | POA: Diagnosis not present

## 2023-04-15 DIAGNOSIS — H43823 Vitreomacular adhesion, bilateral: Secondary | ICD-10-CM | POA: Diagnosis not present

## 2023-04-15 DIAGNOSIS — H2513 Age-related nuclear cataract, bilateral: Secondary | ICD-10-CM | POA: Diagnosis not present

## 2023-04-15 DIAGNOSIS — H33193 Other retinoschisis and retinal cysts, bilateral: Secondary | ICD-10-CM | POA: Diagnosis not present

## 2023-05-01 DIAGNOSIS — Z85828 Personal history of other malignant neoplasm of skin: Secondary | ICD-10-CM | POA: Diagnosis not present

## 2023-05-01 DIAGNOSIS — D2272 Melanocytic nevi of left lower limb, including hip: Secondary | ICD-10-CM | POA: Diagnosis not present

## 2023-05-01 DIAGNOSIS — D225 Melanocytic nevi of trunk: Secondary | ICD-10-CM | POA: Diagnosis not present

## 2023-05-01 DIAGNOSIS — D2261 Melanocytic nevi of right upper limb, including shoulder: Secondary | ICD-10-CM | POA: Diagnosis not present

## 2023-05-09 DIAGNOSIS — F329 Major depressive disorder, single episode, unspecified: Secondary | ICD-10-CM | POA: Diagnosis not present

## 2023-05-09 DIAGNOSIS — F418 Other specified anxiety disorders: Secondary | ICD-10-CM | POA: Diagnosis not present

## 2023-05-31 DIAGNOSIS — F418 Other specified anxiety disorders: Secondary | ICD-10-CM | POA: Diagnosis not present

## 2023-06-21 DIAGNOSIS — F418 Other specified anxiety disorders: Secondary | ICD-10-CM | POA: Diagnosis not present

## 2023-06-21 DIAGNOSIS — F329 Major depressive disorder, single episode, unspecified: Secondary | ICD-10-CM | POA: Diagnosis not present

## 2023-07-12 DIAGNOSIS — F329 Major depressive disorder, single episode, unspecified: Secondary | ICD-10-CM | POA: Diagnosis not present

## 2023-07-12 DIAGNOSIS — F418 Other specified anxiety disorders: Secondary | ICD-10-CM | POA: Diagnosis not present

## 2023-07-26 DIAGNOSIS — F329 Major depressive disorder, single episode, unspecified: Secondary | ICD-10-CM | POA: Diagnosis not present

## 2023-07-26 DIAGNOSIS — F418 Other specified anxiety disorders: Secondary | ICD-10-CM | POA: Diagnosis not present

## 2023-08-21 DIAGNOSIS — Z860100 Personal history of colon polyps, unspecified: Secondary | ICD-10-CM | POA: Diagnosis not present

## 2023-08-21 DIAGNOSIS — Z09 Encounter for follow-up examination after completed treatment for conditions other than malignant neoplasm: Secondary | ICD-10-CM | POA: Diagnosis not present

## 2023-08-21 DIAGNOSIS — K5730 Diverticulosis of large intestine without perforation or abscess without bleeding: Secondary | ICD-10-CM | POA: Diagnosis not present

## 2023-08-21 DIAGNOSIS — D123 Benign neoplasm of transverse colon: Secondary | ICD-10-CM | POA: Diagnosis not present

## 2023-08-23 DIAGNOSIS — F329 Major depressive disorder, single episode, unspecified: Secondary | ICD-10-CM | POA: Diagnosis not present

## 2023-08-23 DIAGNOSIS — F418 Other specified anxiety disorders: Secondary | ICD-10-CM | POA: Diagnosis not present

## 2023-09-13 DIAGNOSIS — F329 Major depressive disorder, single episode, unspecified: Secondary | ICD-10-CM | POA: Diagnosis not present

## 2023-09-13 DIAGNOSIS — F418 Other specified anxiety disorders: Secondary | ICD-10-CM | POA: Diagnosis not present

## 2023-10-11 DIAGNOSIS — F418 Other specified anxiety disorders: Secondary | ICD-10-CM | POA: Diagnosis not present

## 2023-10-11 DIAGNOSIS — F329 Major depressive disorder, single episode, unspecified: Secondary | ICD-10-CM | POA: Diagnosis not present

## 2023-11-01 DIAGNOSIS — F329 Major depressive disorder, single episode, unspecified: Secondary | ICD-10-CM | POA: Diagnosis not present

## 2023-11-01 DIAGNOSIS — F418 Other specified anxiety disorders: Secondary | ICD-10-CM | POA: Diagnosis not present

## 2023-12-06 DIAGNOSIS — F418 Other specified anxiety disorders: Secondary | ICD-10-CM | POA: Diagnosis not present

## 2023-12-25 DIAGNOSIS — I10 Essential (primary) hypertension: Secondary | ICD-10-CM | POA: Diagnosis not present

## 2023-12-25 DIAGNOSIS — Z125 Encounter for screening for malignant neoplasm of prostate: Secondary | ICD-10-CM | POA: Diagnosis not present

## 2023-12-25 DIAGNOSIS — R6882 Decreased libido: Secondary | ICD-10-CM | POA: Diagnosis not present

## 2023-12-25 DIAGNOSIS — Z0000 Encounter for general adult medical examination without abnormal findings: Secondary | ICD-10-CM | POA: Diagnosis not present

## 2024-01-07 DIAGNOSIS — F418 Other specified anxiety disorders: Secondary | ICD-10-CM | POA: Diagnosis not present

## 2024-01-07 DIAGNOSIS — F329 Major depressive disorder, single episode, unspecified: Secondary | ICD-10-CM | POA: Diagnosis not present

## 2024-01-31 DIAGNOSIS — F418 Other specified anxiety disorders: Secondary | ICD-10-CM | POA: Diagnosis not present

## 2024-01-31 DIAGNOSIS — F329 Major depressive disorder, single episode, unspecified: Secondary | ICD-10-CM | POA: Diagnosis not present
